# Patient Record
Sex: Male | Born: 2000 | Race: Black or African American | Hispanic: No | Marital: Single | Smoking: Never smoker
Health system: Southern US, Community
[De-identification: ages and names within clinical notes are randomized; demographics above are authoritative.]

---
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
## 2022-02-27 NOTE — Progress Notes (Signed)
No chief complaint on file.

## 2022-03-02 NOTE — Progress Notes (Signed)
No chief complaint on file.
